# Patient Record
Sex: Male | Born: 1990 | Race: Black or African American | Hispanic: No | Marital: Single | Smoking: Never smoker
Health system: Southern US, Community
[De-identification: ages and names within clinical notes are randomized; demographics above are authoritative.]

---

## 2012-09-30 ENCOUNTER — Encounter (HOSPITAL_COMMUNITY): Payer: Self-pay | Admitting: Emergency Medicine

## 2012-09-30 ENCOUNTER — Emergency Department (HOSPITAL_COMMUNITY)
Admission: EM | Admit: 2012-09-30 | Discharge: 2012-09-30 | Disposition: A | Discharge status: Home / Self Care | Payer: Medicaid Other | Attending: Emergency Medicine | Admitting: Emergency Medicine

## 2012-09-30 DIAGNOSIS — B356 Tinea cruris: Secondary | ICD-10-CM | POA: Insufficient documentation

## 2012-09-30 DIAGNOSIS — Z79899 Other long term (current) drug therapy: Secondary | ICD-10-CM | POA: Insufficient documentation

## 2012-09-30 MED ORDER — CLOTRIMAZOLE 1 % EX CREA: TOPICAL_CREAM | Freq: Two times a day (BID) | CUTANEOUS | Status: AC

## 2012-09-30 NOTE — ED Notes (Signed)
Pt reports for about 4 weeks, having itching/burning to scrotal area; mom reports that she thought it was jock itch so bought some OTC meds, reports does not think pt followed instructions on taking meds; continues to get worse; pt does report hx of sexual activity but none recently

## 2012-09-30 NOTE — ED Provider Notes (Signed)
History  Scribed for Nelia Shi, MD, the patient was seen in room TR10C/TR10C. This chart was scribed by Candelaria Stagers. The patient's care started at 1:25 PM   CSN: [id number]  Arrival date & time 09/30/12  1253   First MD Initiated Contact with Patient 09/30/12 1315      Chief Complaint  Patient presents with  . Rash    The history is provided by the patient. No language interpreter was used.   Steven Cowan is a 21 y.o. male who presents to the Emergency Department complaining of a rash to the groin area that started several weeks ago.  Pt treated the rash as jock itch with cream with no relief.  Pt states that the rash burns when showering.    History reviewed. No pertinent past medical history.  History reviewed. No pertinent past surgical history.  History reviewed. No pertinent family history.  History  Substance Use Topics  . Smoking status: Never Smoker   . Smokeless tobacco: Not on file  . Alcohol Use: Yes     Comment: rarely      Review of Systems  All other systems reviewed and are negative.    Allergies  Review of patient's allergies indicates no known allergies.  Home Medications   Current Outpatient Rx  Name  Route  Sig  Dispense  Refill  . CLOTRIMAZOLE 1 % EX CREA   Topical   Apply topically 2 (two) times daily.   30 g   0     BP 128/66  Pulse 57  Temp 97.9 F (36.6 C) (Oral)  Resp 18  SpO2 100%  Physical Exam  Nursing note and vitals reviewed. Constitutional: He is oriented to person, place, and time. He appears well-developed and well-nourished. No distress.  HENT:  Head: Normocephalic and atraumatic.  Eyes: Pupils are equal, round, and reactive to light.  Neck: Normal range of motion.  Cardiovascular: Normal rate and intact distal pulses.   Pulmonary/Chest: No respiratory distress.  Abdominal: Normal appearance. He exhibits no distension.  Musculoskeletal: Normal range of motion.  Neurological: He is alert and oriented to  person, place, and time. No cranial nerve deficit.  Skin: Skin is warm and dry. Rash (c/w tinea. located groin area) noted.  Psychiatric: He has a normal mood and affect. His behavior is normal.    ED Course  Procedures   DIAGNOSTIC STUDIES:  COORDINATION OF CARE:     Labs Reviewed - No data to display No results found.   1. Tinea cruris       MDM  I personally performed the services described in this documentation, which was scribed in my presence. The recorded information has been reviewed and considered. I personally performed the services described in this documentation, which was scribed in my presence. The recorded information has been reviewed and considered.       Nelia Shi, MD 10/03/12 [phone]

## 2012-09-30 NOTE — Discharge Instructions (Signed)
Jock Itch  Jock itch is a germ infection of the groin and upper thighs. The type of germ that causes jock itch is a fungus. It is itchy and often feels like it is burning. It is common in people who play sports. Sweating and wearing certain athletic gear can cause this type of rash.  HOME CARE  · Take your medicines as told. Finish them even if you start to feel better.  · Wear loose-fitting clothing.  · Men should wear cotton boxer shorts.  · Women should wear cotton underwear.  · Avoid hot baths.  · Dry the groin area well after bathing.  GET HELP RIGHT AWAY IF:   · Your rash is worse or spreading.  · Your rash returns after treatment is finished.  · Your rash is not gone in 4 weeks.  · The area becomes red, warm, tender, and puffy (swollen).  · You have a fever.  MAKE SURE YOU:  · Understand these instructions.  · Will watch your condition.  · Will get help right away if you are not doing well or get worse.  Document Released: 02/09/2010 Document Revised: 02/07/2012 Document Reviewed: 02/09/2010  [employer]® Patient Information ©2013 [employer].

## 2019-01-23 ENCOUNTER — Encounter (HOSPITAL_COMMUNITY): Payer: Self-pay

## 2019-01-23 ENCOUNTER — Emergency Department (HOSPITAL_COMMUNITY)
Admission: EM | Admit: 2019-01-23 | Discharge: 2019-01-23 | Disposition: A | Discharge status: Home / Self Care | Payer: Self-pay | Attending: Emergency Medicine | Admitting: Emergency Medicine

## 2019-01-23 ENCOUNTER — Emergency Department (HOSPITAL_COMMUNITY): Payer: Self-pay

## 2019-01-23 DIAGNOSIS — J069 Acute upper respiratory infection, unspecified: Secondary | ICD-10-CM

## 2019-01-23 DIAGNOSIS — R1013 Epigastric pain: Secondary | ICD-10-CM

## 2019-01-23 LAB — COMPREHENSIVE METABOLIC PANEL
ALBUMIN: 4.4 g/dL (ref 3.5–5.0)
ALT: 19 U/L (ref 0–44)
ANION GAP: 8 (ref 5–15)
AST: 34 U/L (ref 15–41)
Alkaline Phosphatase: 63 U/L (ref 38–126)
BUN: 7 mg/dL (ref 6–20)
CALCIUM: 9 mg/dL (ref 8.9–10.3)
CHLORIDE: 106 mmol/L (ref 98–111)
CO2: 26 mmol/L (ref 22–32)
Creatinine, Ser: 0.95 mg/dL (ref 0.61–1.24)
GFR calc Af Amer: >60 mL/min (ref >60)
GFR calc non Af Amer: >60 mL/min (ref >60)
GLUCOSE: 90 mg/dL (ref 70–99)
Potassium: 3.3 mmol/L — ABNORMAL LOW (ref 3.5–5.1)
SODIUM: 140 mmol/L (ref 135–145)
Total Bilirubin: 0.1 mg/dL — ABNORMAL LOW (ref 0.3–1.2)
Total Protein: 7.1 g/dL (ref 6.5–8.1)

## 2019-01-23 LAB — CBC WITH DIFFERENTIAL/PLATELET
ABS IMMATURE GRANULOCYTES: 0.02 10*3/uL (ref 0.00–0.07)
BASOS ABS: 0 10*3/uL (ref 0.0–0.1)
BASOS PCT: 0 %
Eosinophils Absolute: 0.2 10*3/uL (ref 0.0–0.5)
Eosinophils Relative: 5 %
HCT: 43.1 % (ref 39.0–52.0)
HEMOGLOBIN: 13.6 g/dL (ref 13.0–17.0)
Immature Granulocytes: 0 %
LYMPHS ABS: 1.3 10*3/uL (ref 0.7–4.0)
LYMPHS PCT: 25 %
MCH: 29.6 pg (ref 26.0–34.0)
MCHC: 31.6 g/dL (ref 30.0–36.0)
MCV: 93.7 fL (ref 80.0–100.0)
MONO ABS: 0.7 10*3/uL (ref 0.1–1.0)
MONOS PCT: 14 %
Neutro Abs: 2.8 10*3/uL (ref 1.7–7.7)
Neutrophils Relative %: 56 %
PLATELETS: 189 10*3/uL (ref 150–400)
RBC: 4.6 MIL/uL (ref 4.22–5.81)
RDW: 11.6 % (ref 11.5–15.5)
WBC: 5 10*3/uL (ref 4.0–10.5)
nRBC: 0 % (ref 0.0–0.2)

## 2019-01-23 LAB — URINALYSIS, ROUTINE W REFLEX MICROSCOPIC
BILIRUBIN URINE: NEGATIVE
GLUCOSE, UA: NEGATIVE mg/dL
Hgb urine dipstick: NEGATIVE
KETONES UR: 5 mg/dL — AB
LEUKOCYTE UA: NEGATIVE
NITRITE: NEGATIVE
PROTEIN: 30 mg/dL — AB
Specific Gravity, Urine: 1.034 — ABNORMAL HIGH (ref 1.005–1.030)
pH: 5 (ref 5.0–8.0)

## 2019-01-23 LAB — LIPASE, BLOOD: Lipase: 33 U/L (ref 11–51)

## 2019-01-23 MED ORDER — KETOROLAC TROMETHAMINE 30 MG/ML IJ SOLN
30.0000 mg | Freq: Once | INTRAMUSCULAR | Status: AC
  Administered 2019-01-23: 30 mg via INTRAVENOUS
  Filled 2019-01-23: qty 1

## 2019-01-23 MED ORDER — ONDANSETRON HCL 4 MG PO TABS: 4.0000 mg | ORAL_TABLET | Freq: Three times a day (TID) | ORAL | 0 refills | Status: DC | PRN

## 2019-01-23 MED ORDER — SODIUM CHLORIDE 0.9 % IV BOLUS
1000.0000 mL | Freq: Once | INTRAVENOUS | Status: AC
  Administered 2019-01-23: 1000 mL via INTRAVENOUS

## 2019-01-23 MED ORDER — FAMOTIDINE 20 MG PO TABS: 20.0000 mg | ORAL_TABLET | Freq: Two times a day (BID) | ORAL | 0 refills | Status: AC

## 2019-01-23 MED ORDER — FLUTICASONE PROPIONATE 50 MCG/ACT NA SUSP: 1.0000 | Freq: Every day | NASAL | 0 refills | Status: AC

## 2019-01-23 NOTE — Discharge Instructions (Signed)
You were seen in the emergency department for central abdominal pain and also for pain around your eyes.  You had lab work that was unremarkable.  We are sending you with prescriptions for some acid medication for your stomach along with some nausea medicine.  There is also a steroid nasal spray to help relieve the congestion in your nose.  You should follow-up with your doctor and please return if any worsening symptoms.

## 2019-01-23 NOTE — ED Notes (Signed)
Patient transported to X-ray 

## 2019-01-23 NOTE — ED Provider Notes (Signed)
[facility]-EMERGENCY DEPT Provider Note   CSN: [id number] Arrival date & time: 01/23/19  7622    History   Chief Complaint Chief Complaint  Patient presents with  . URI  . Abdominal Pain    HPI Steven Cowan is a 28 y.o. male.  He has no significant past medical history.  He is complaining of getting sick starting 5 days ago when he acutely felt nauseous and vomited during dinner.  Since then he has had sharp upper abdominal pain that is been constant 9 out of 10 intensity.  There is been no further nausea vomiting or diarrhea or constipation but he does have decreased appetite.  He has been tolerating liquids.  He is also had some nasal congestion and gets a sharp pain in his head around his eyes when he blows his nose.  He feels some throat and chest congestion.  He is coughing up a little bit of phlegm.  No known fever.  No sick contacts or recent travel.     The history is provided by the patient.  Abdominal Pain  Pain location:  Epigastric Pain quality: stabbing   Pain radiates to:  Does not radiate Pain severity:  Severe Onset quality:  Gradual Timing:  Constant Progression:  Unchanged Chronicity:  New Context: recent illness   Context: not recent travel, not sick contacts and not trauma   Relieved by:  Nothing Worsened by:  Nothing Ineffective treatments:  Acetaminophen Associated symptoms: anorexia, cough, nausea and vomiting   Associated symptoms: no chest pain, no diarrhea, no dysuria, no fever, no hematemesis, no hematuria, no shortness of breath and no sore throat     History reviewed. No pertinent past medical history.  There are no active problems to display for this patient.   History reviewed. No pertinent surgical history.      Home Medications    Prior to Admission medications   Medication Sig Start Date End Date Taking? Authorizing Provider  clotrimazole ([employer] CLOTRIMAZOLE) 1 % cream Apply topically 2 (two) times daily.  09/30/12   Nelva Nay, MD    Family History History reviewed. No pertinent family history.  Social History Social History   Tobacco Use  . Smoking status: Never Smoker  Substance Use Topics  . Alcohol use: Yes    Comment: rarely  . Drug use: No     Allergies   Patient has no known allergies.   Review of Systems Review of Systems  Constitutional: Negative for fever.  HENT: Positive for rhinorrhea and sinus pain. Negative for sore throat.   Eyes: Negative for visual disturbance.  Respiratory: Positive for cough. Negative for shortness of breath.   Cardiovascular: Negative for chest pain.  Gastrointestinal: Positive for abdominal pain, anorexia, nausea and vomiting. Negative for diarrhea and hematemesis.  Genitourinary: Negative for dysuria and hematuria.  Musculoskeletal: Negative for neck pain.  Skin: Negative for rash.  Neurological: Negative for headaches.     Physical Exam Updated Vital Signs BP 126/66 (BP Location: Left Arm)   Pulse 81   Temp 97.7 F (36.5 C) (Oral)   Resp 14   SpO2 100%   Physical Exam Vitals signs and nursing note reviewed.  Constitutional:      Appearance: He is well-developed.  HENT:     Head: Normocephalic and atraumatic.  Eyes:     Conjunctiva/sclera: Conjunctivae normal.  Neck:     Musculoskeletal: Neck supple.  Cardiovascular:     Rate and Rhythm: Normal rate and regular rhythm.  Heart sounds: No murmur.  Pulmonary:     Effort: Pulmonary effort is normal. No respiratory distress.     Breath sounds: Normal breath sounds.  Abdominal:     Palpations: Abdomen is soft.     Tenderness: There is generalized abdominal tenderness. There is no guarding or rebound.  Skin:    General: Skin is warm and dry.     Capillary Refill: Capillary refill takes less than 2 seconds.  Neurological:     General: No focal deficit present.     Mental Status: He is alert and oriented to person, place, and time.      ED Treatments /  Results  Labs (all labs ordered are listed, but only abnormal results are displayed) Labs Reviewed  COMPREHENSIVE METABOLIC PANEL - Abnormal; Notable for the following components:      Result Value   Potassium 3.3 (*)    Total Bilirubin 0.1 (*)    All other components within normal limits  URINALYSIS, ROUTINE W REFLEX MICROSCOPIC - Abnormal; Notable for the following components:   Specific Gravity, Urine 1.034 (*)    Ketones, ur 5 (*)    Protein, ur 30 (*)    Bacteria, UA RARE (*)    All other components within normal limits  CBC WITH DIFFERENTIAL/PLATELET  LIPASE, BLOOD    EKG None  Radiology Dg Chest 2 View  Result Date: 01/23/2019 CLINICAL DATA:  Cough EXAM: CHEST - 2 VIEW COMPARISON:  None. FINDINGS: Lungs are clear. Heart size and pulmonary vascularity are normal. No adenopathy. No bone lesions. IMPRESSION: No edema or consolidation. Electronically Signed   By: Bretta Bang III M.D.   On: 01/23/2019 09:15    Procedures Procedures (including critical care time)  Medications Ordered in ED Medications  sodium chloride 0.9 % bolus 1,000 mL (has no administration in time range)  ketorolac (TORADOL) 30 MG/ML injection 30 mg (has no administration in time range)     Initial Impression / Assessment and Plan / ED Course  I have reviewed the triage vital signs and the nursing notes.  Pertinent labs & imaging results that were available during my care of the patient were reviewed by me and considered in my medical decision making (see chart for details).  Clinical Course as of Jan 24 754  Tue Jan 23, 2019  6769 28 year old male with for 5 days of upper abdominal pain constant unrelated to food.  He also has some upper respiratory infection symptoms.  His vitals are normal here including being afebrile.  He has a fairly benign exam with some vague abdominal tenderness but no guarding or rebound.  We are getting some screening labs and chest x-ray.   [MB]  [phone] Patient  received some Toradol and fluids and he states he is feeling better.  His work-up is been fairly unremarkable other than a slightly low potassium.  Chest x-ray negative.  We will start him on some acid medication and have him follow-up with his primary care doctor.   [MB]    Clinical Course User Index [MB] Terrilee Files, MD        Final Clinical Impressions(s) / ED Diagnoses   Final diagnoses:  Epigastric pain  Upper respiratory tract infection, unspecified type    ED Discharge Orders         Ordered    famotidine (PEPCID) 20 MG tablet  2 times daily     01/23/19 0944    ondansetron (ZOFRAN) 4 MG tablet  Every 8  hours PRN     01/23/19 0944    fluticasone (FLONASE) 50 MCG/ACT nasal spray  Daily     01/23/19 0946           Terrilee Files, MD 01/24/19 [phone]

## 2019-01-23 NOTE — ED Triage Notes (Addendum)
Patient c/o 1 episode of N/V that started about 4 days ago.  C/O generalized upper sharp abdominal pain.  C/O upper respiratory symptoms and not being able to breath as good as normal.  C/O head pressure in his eye balls. 9/10 pressure pain.  C/O runny nose and productive cough (yellow/brown/thick).   Denies diarrhea.    Patient has been taking over the counter severe cold and flu with no relief.   Patient here with mom.   Ambulatory in triage.

## 2019-01-23 NOTE — ED Notes (Signed)
Patient given discharge teaching and verbalized understanding. Patient ambulated out of ED with a steady gait. 

## 2020-02-03 IMAGING — CR DG CHEST 2V
2 series · 2 of 2 positions shown · non-contrast
Comparison: None.

CLINICAL DATA: Cough

EXAM:
CHEST - 2 VIEW

[w chest pa]
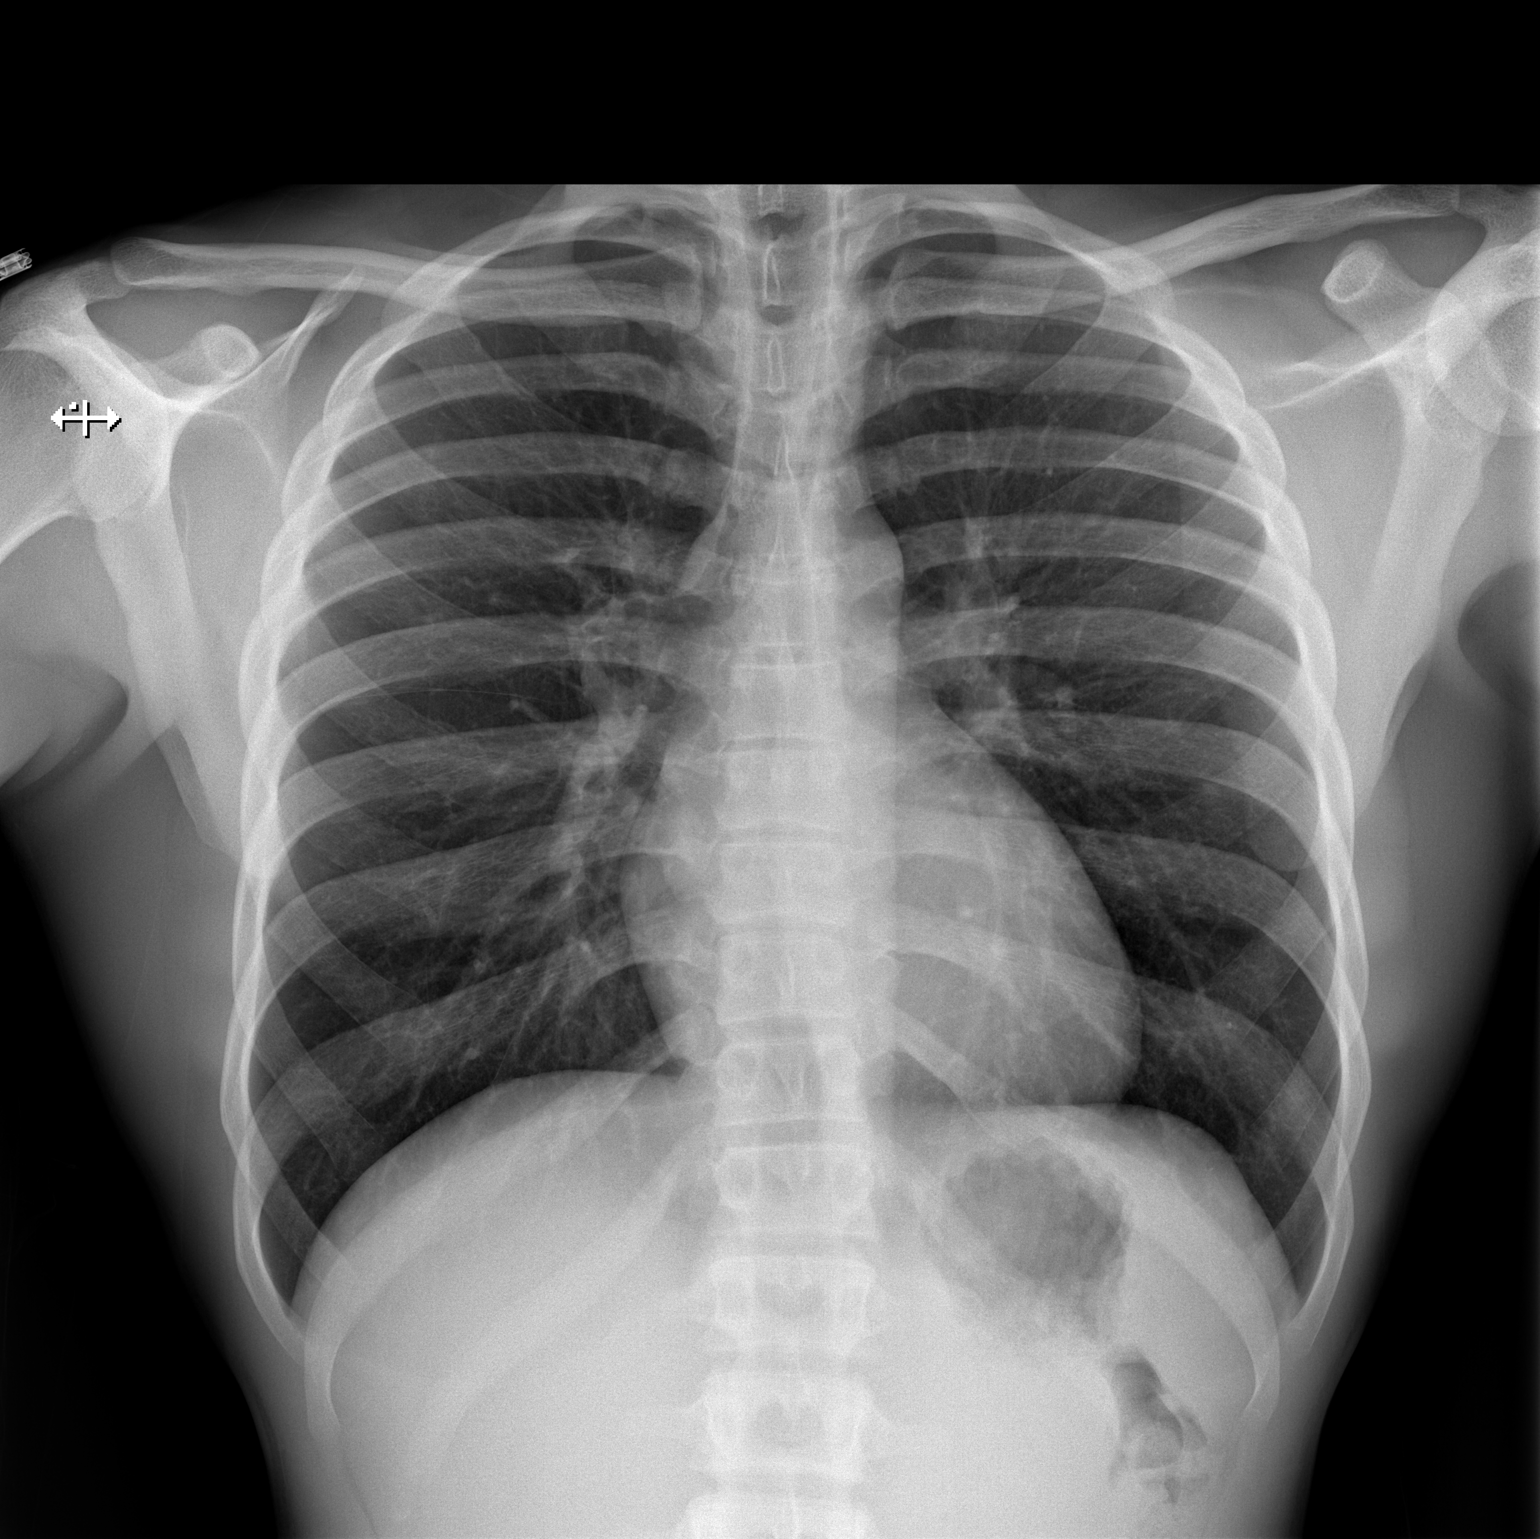

[w chest lat]
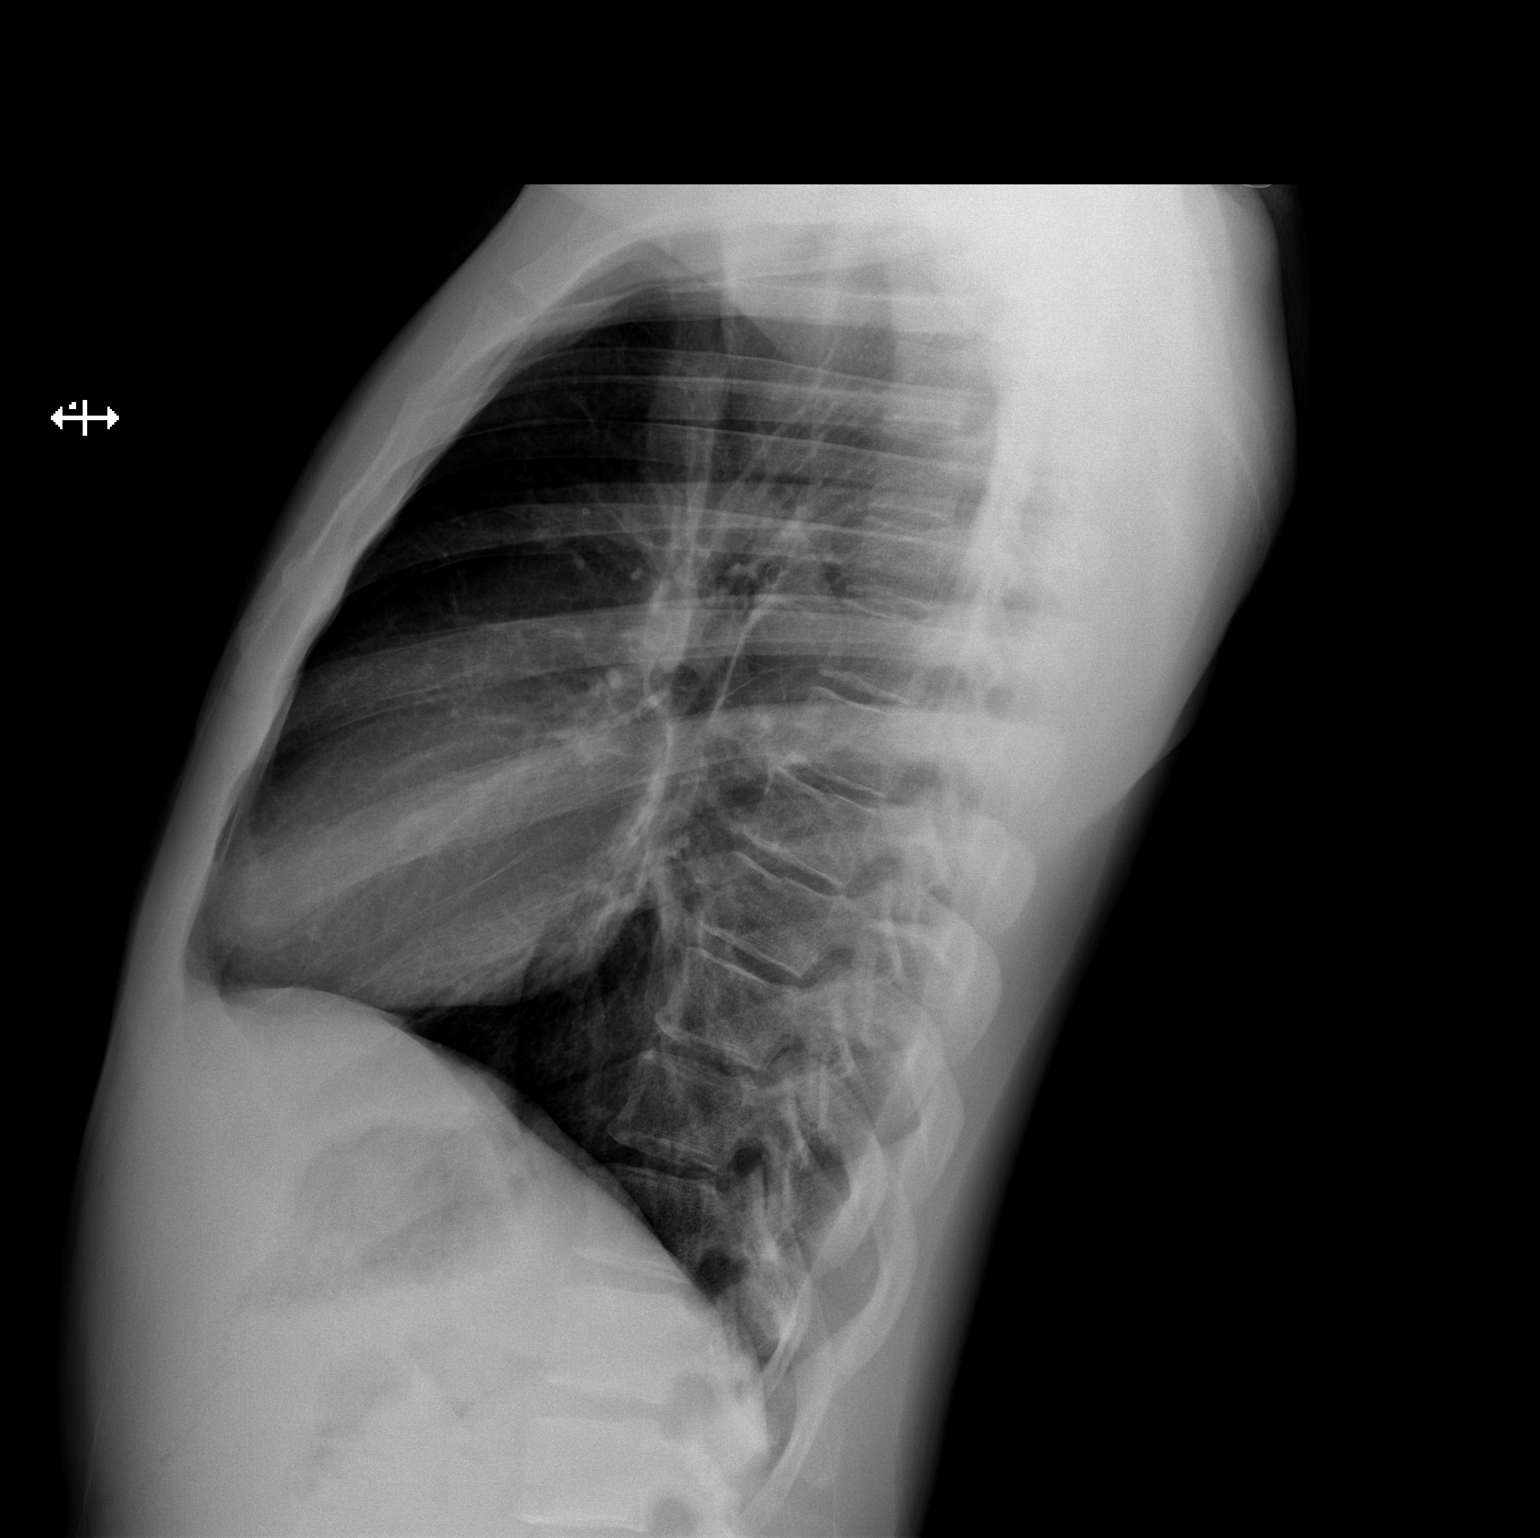

[2 of 2 positions shown; findings below may reference images not displayed]

FINDINGS: Lungs are clear. Heart size and pulmonary vascularity are normal. No
adenopathy. No bone lesions.
IMPRESSION: No edema or consolidation.

## 2020-03-10 ENCOUNTER — Other Ambulatory Visit: Payer: Self-pay

## 2020-03-10 ENCOUNTER — Emergency Department (HOSPITAL_COMMUNITY)
Admission: EM | Admit: 2020-03-10 | Discharge: 2020-03-10 | Disposition: A | Discharge status: Home / Self Care | Payer: Self-pay | Attending: Emergency Medicine | Admitting: Emergency Medicine

## 2020-03-10 ENCOUNTER — Encounter (HOSPITAL_COMMUNITY): Payer: Self-pay | Admitting: Emergency Medicine

## 2020-03-10 DIAGNOSIS — R05 Cough: Secondary | ICD-10-CM | POA: Insufficient documentation

## 2020-03-10 DIAGNOSIS — Z20822 Contact with and (suspected) exposure to covid-19: Secondary | ICD-10-CM | POA: Insufficient documentation

## 2020-03-10 DIAGNOSIS — R0602 Shortness of breath: Secondary | ICD-10-CM | POA: Insufficient documentation

## 2020-03-10 DIAGNOSIS — J069 Acute upper respiratory infection, unspecified: Secondary | ICD-10-CM | POA: Insufficient documentation

## 2020-03-10 LAB — SARS CORONAVIRUS 2 (TAT 6-24 HRS): SARS Coronavirus 2: NEGATIVE

## 2020-03-10 NOTE — ED Provider Notes (Signed)
[facility] DEPT Provider Note   CSN: [id number] Arrival date & time: 03/10/20  1138     History Chief Complaint  Patient presents with  . Nasal Congestion    Steven Cowan is a 29 y.o. male with no significant past med history presenting to emergency department with congestion and cough for approximately 1 week.  Says check several times her temperature and has not had a temperature higher than 22F. He has had a persistent cough for the past week.  Has been self medicating with NyQuil at home which seems to help.  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  He has not had his Covid vaccines.   He lives in a house with some younger siblings.  His younger sister recently began having a fever and URI symptoms yesterday.  They also live with an 27 year old grandparent in the house.  He does not smoke.  He has no other medical problems.  HPI     History reviewed. No pertinent past medical history.  There are no problems to display for this patient.   No past surgical history on file.     No family history on file.  Social History   Tobacco Use  . Smoking status: Never Smoker  Substance Use Topics  . Alcohol use: Yes    Comment: rarely  . Drug use: No    Home Medications Prior to Admission medications   Medication Sig Start Date End Date Taking? Authorizing Provider  clotrimazole ([employer] CLOTRIMAZOLE) 1 % cream Apply topically 2 (two) times daily. 09/30/12   Leonard Schwartz, MD  famotidine (PEPCID) 20 MG tablet Take 1 tablet (20 mg total) by mouth 2 (two) times daily. 01/23/19   Hayden Rasmussen, MD  fluticasone (FLONASE) 50 MCG/ACT nasal spray Place 1 spray into both nostrils daily. 01/23/19   Hayden Rasmussen, MD  ondansetron (ZOFRAN) 4 MG tablet Take 1 tablet (4 mg total) by mouth every 8 (eight) hours as needed for nausea or vomiting. 01/23/19   Hayden Rasmussen, MD    Allergies    Patient has no known  allergies.  Review of Systems   Review of Systems  Constitutional: Negative for chills and fever.  HENT: Positive for congestion and rhinorrhea. Negative for ear pain and sore throat.   Eyes: Negative for pain and visual disturbance.  Respiratory: Positive for cough, chest tightness and shortness of breath.   Cardiovascular: Negative for chest pain and palpitations.  Gastrointestinal: Negative for abdominal pain and vomiting.  Genitourinary: Negative for dysuria and hematuria.  Musculoskeletal: Negative for arthralgias and myalgias.  Skin: Negative for color change and rash.  Neurological: Positive for headaches. Negative for syncope.  Psychiatric/Behavioral: Negative for agitation and confusion.  All other systems reviewed and are negative.   Physical Exam Updated Vital Signs BP 130/76 (BP Location: Right Arm)   Pulse 89   Temp 98 F (36.7 C) (Oral)   Resp 19   SpO2 100%   Physical Exam Vitals and nursing note reviewed.  Constitutional:      Appearance: He is well-developed.  HENT:     Head: Normocephalic and atraumatic.  Eyes:     Conjunctiva/sclera: Conjunctivae normal.  Cardiovascular:     Rate and Rhythm: Normal rate and regular rhythm.     Pulses: Normal pulses.     Heart sounds: No murmur.  Pulmonary:     Effort: Pulmonary effort is normal. No respiratory distress.  Breath sounds: Normal breath sounds.  Abdominal:     Palpations: Abdomen is soft.     Tenderness: There is no abdominal tenderness.  Musculoskeletal:     Cervical back: Neck supple.  Skin:    General: Skin is warm and dry.  Neurological:     General: No focal deficit present.     Mental Status: He is alert and oriented to person, place, and time.  Psychiatric:        Mood and Affect: Mood normal.        Behavior: Behavior normal.     ED Results / Procedures / Treatments   Labs (all labs ordered are listed, but only abnormal results are displayed) Labs Reviewed  SARS CORONAVIRUS 2  (TAT 6-24 HRS)    EKG None  Radiology No results found.  Procedures Procedures (including critical care time)  Medications Ordered in ED Medications - No data to display  ED Course  I have reviewed the triage vital signs and the nursing notes.  Pertinent labs & imaging results that were available during my care of the patient were reviewed by me and considered in my medical decision making (see chart for details).  29 yo male here with viral URI symptoms for several days Cough and congestion ongoing, taking OTC medications with some relief  He is concerned about COVID-19.  We'll send out a test.  He lives with a younger sister who is asthmatic and an 22 year old grandparent, and he works in a public setting.  It's reasonable to test him.  No evidence of bacterial PNA on my exam.  He is quite well appearing.  Stable on room air.  NO respiratory distress.  Will discharge.  Steven Cowan was evaluated in Emergency Department on 03/10/2020 for the symptoms described in the history of present illness. He was evaluated in the context of the global COVID-19 pandemic, which necessitated consideration that the patient might be at risk for infection with the SARS-CoV-2 virus that causes COVID-19. Institutional protocols and algorithms that pertain to the evaluation of patients at risk for COVID-19 are in a state of rapid change based on information released by regulatory bodies including the CDC and federal and state organizations. These policies and algorithms were followed during the patient's care in the ED.    Final Clinical Impression(s) / ED Diagnoses Final diagnoses:  Upper respiratory tract infection, unspecified type    Rx / DC Orders ED Discharge Orders    None       Reigan Tolliver, Kermit Balo, MD 03/10/20 [phone]

## 2020-03-10 NOTE — ED Triage Notes (Signed)
Patient here from home with complaints of stuffy nose and phlegm x3 days. Denies sore throat.

## 2020-03-10 NOTE — Discharge Instructions (Addendum)
Your covid test will result in 24- 48 hours.  You can check online for your results.

## 2021-07-10 DIAGNOSIS — E559 Vitamin D deficiency, unspecified: Secondary | ICD-10-CM | POA: Diagnosis not present

## 2021-07-10 DIAGNOSIS — Z13220 Encounter for screening for lipoid disorders: Secondary | ICD-10-CM | POA: Diagnosis not present

## 2021-07-10 DIAGNOSIS — Z131 Encounter for screening for diabetes mellitus: Secondary | ICD-10-CM | POA: Diagnosis not present

## 2021-07-10 DIAGNOSIS — H6521 Chronic serous otitis media, right ear: Secondary | ICD-10-CM | POA: Diagnosis not present

## 2021-07-10 DIAGNOSIS — Z0000 Encounter for general adult medical examination without abnormal findings: Secondary | ICD-10-CM | POA: Diagnosis not present

## 2021-07-17 DIAGNOSIS — K5289 Other specified noninfective gastroenteritis and colitis: Secondary | ICD-10-CM | POA: Diagnosis not present

## 2021-08-12 ENCOUNTER — Emergency Department (HOSPITAL_COMMUNITY)
Admission: EM | Admit: 2021-08-12 | Discharge: 2021-08-12 | Disposition: A | Discharge status: Home / Self Care | Payer: BC Managed Care – PPO | Attending: Emergency Medicine | Admitting: Emergency Medicine

## 2021-08-12 ENCOUNTER — Encounter (HOSPITAL_COMMUNITY): Payer: Self-pay

## 2021-08-12 DIAGNOSIS — H9311 Tinnitus, right ear: Secondary | ICD-10-CM | POA: Diagnosis not present

## 2021-08-12 DIAGNOSIS — H938X1 Other specified disorders of right ear: Secondary | ICD-10-CM

## 2021-08-12 DIAGNOSIS — R110 Nausea: Secondary | ICD-10-CM | POA: Insufficient documentation

## 2021-08-12 DIAGNOSIS — M79601 Pain in right arm: Secondary | ICD-10-CM | POA: Insufficient documentation

## 2021-08-12 DIAGNOSIS — H9201 Otalgia, right ear: Secondary | ICD-10-CM | POA: Diagnosis not present

## 2021-08-12 MED ORDER — MECLIZINE HCL 25 MG PO TABS: 25.0000 mg | ORAL_TABLET | Freq: Three times a day (TID) | ORAL | 0 refills | Status: AC | PRN

## 2021-08-12 MED ORDER — METHOCARBAMOL 500 MG PO TABS
500.0000 mg | ORAL_TABLET | Freq: Two times a day (BID) | ORAL | 0 refills | Status: AC
Start: 2021-08-12 — End: ?

## 2021-08-12 MED ORDER — ONDANSETRON 4 MG PO TBDP: 4.0000 mg | ORAL_TABLET | Freq: Three times a day (TID) | ORAL | 0 refills | Status: AC | PRN

## 2021-08-12 NOTE — ED Notes (Signed)
An After Visit Summary was printed and given to the patient. Discharge instructions given and no further questions at this time.  

## 2021-08-12 NOTE — ED Triage Notes (Signed)
Pt arrived via POV, c/o right ear pressure, worsening hearing difficulty, pain in ear, that is causing pressure in ear. Was seen by PCP, given abx for ear infection, no relief.

## 2021-08-12 NOTE — ED Provider Notes (Signed)
[facility]-EMERGENCY DEPT Provider Note   CSN: [id number] Arrival date & time: 08/12/21  1122     History Chief Complaint  Patient presents with   Ear Fullness    Steven Cowan is a 30 y.o. male with no significant past medical history who presents with 1 to 2 weeks of ear pain, pressure, dizziness, nausea, and ringing.  Additionally patient complains of right shoulder pain when crossing his arm across the body.  Patient reports that he was seen by his primary care, who diagnosed him with an ear infection, and prescribed him antibiotics.  Patient reports that he took all of the antibiotics except for 1 day, reports improvement of some of the symptoms, but that they have returned at this time.  Patient reports vertigo like events this morning where he sat up suddenly from bed, felt nauseous, dizzy, and blacked out and had to collapse back into the bed.  Patient also endorses a constant high-pitched ringing in his right ear.  Patient reports some arm pain specifically when lifting his arm however his head, or crossing across his chest.  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  Patient is not taking anything for his arm pain at this time.   Ear Fullness Associated symptoms include headaches.      History reviewed. No pertinent past medical history.  There are no problems to display for this patient.   History reviewed. No pertinent surgical history.     History reviewed. No pertinent family history.  Social History   Tobacco Use   Smoking status: Never   Smokeless tobacco: Never  Substance Use Topics   Alcohol use: Yes    Comment: rarely   Drug use: No    Home Medications Prior to Admission medications   Medication Sig Start Date End Date Taking? Authorizing Provider  meclizine (ANTIVERT) 25 MG tablet Take 1 tablet (25 mg total) by mouth 3 (three) times  daily as needed for dizziness. Take as needed for vertigo symptoms 08/12/21  Yes Nevin Kozuch H, PA-C  methocarbamol (ROBAXIN) 500 MG tablet Take 1 tablet (500 mg total) by mouth 2 (two) times daily. 08/12/21  Yes Shadie Sweatman H, PA-C  ondansetron (ZOFRAN ODT) 4 MG disintegrating tablet Take 1 tablet (4 mg total) by mouth every 8 (eight) hours as needed for nausea or vomiting. 08/12/21  Yes Inita Uram H, PA-C  clotrimazole ([facility] CLOTRIMAZOLE) 1 % cream Apply topically 2 (two) times daily. 09/30/12   Nelva Nay, MD  famotidine (PEPCID) 20 MG tablet Take 1 tablet (20 mg total) by mouth 2 (two) times daily. 01/23/19   Terrilee Files, MD  fluticasone (FLONASE) 50 MCG/ACT nasal spray Place 1 spray into both nostrils daily. 01/23/19   Terrilee Files, MD    Allergies    Patient has no known allergies.  Review of Systems   Review of Systems  HENT:  Positive for ear pain and tinnitus. Negative for ear discharge, rhinorrhea and sinus pressure.   Neurological:  Positive for dizziness and headaches. Negative for light-headedness.  All other systems reviewed and are negative.  Physical Exam Updated Vital Signs BP 121/79   Pulse 65   Temp 98.1 F (36.7 C) (Oral)   Resp 18   SpO2 99%   Physical Exam Vitals and nursing note reviewed.  Constitutional:      General: He is not in  acute distress.    Appearance: Normal appearance.  HENT:     Head: Normocephalic and atraumatic.     Right Ear: Tympanic membrane, ear canal and external ear normal. There is no impacted cerumen.     Left Ear: Tympanic membrane, ear canal and external ear normal. There is no impacted cerumen.     Ears:     Comments: Able to hear whispered voice bilaterally    Nose: No congestion or rhinorrhea.  Eyes:     General:        Right eye: No discharge.        Left eye: No discharge.  Cardiovascular:     Rate and Rhythm: Normal rate and regular rhythm.     Comments: Radial and ulnar pulses intact on  the right side Pulmonary:     Effort: Pulmonary effort is normal. No respiratory distress.  Musculoskeletal:        General: No deformity.     Comments: TTP over clavicle, head of humerus on right side. 5/5 strength to flexion, extension, and internal and external rotation. Pain with cross-body adduction. No step offs or focal point tenderness.  Skin:    General: Skin is warm and dry.     Capillary Refill: Capillary refill takes less than 2 seconds.  Neurological:     Mental Status: He is alert and oriented to person, place, and time.     Comments: Right arm without numbness or tingling  Psychiatric:        Mood and Affect: Mood normal.        Behavior: Behavior normal.    ED Results / Procedures / Treatments   Labs (all labs ordered are listed, but only abnormal results are displayed) Labs Reviewed - No data to display  EKG None  Radiology No results found.  Procedures Procedures   Medications Ordered in ED Medications - No data to display  ED Course  I have reviewed the triage vital signs and the nursing notes.  Pertinent labs & imaging results that were available during my care of the patient were reviewed by me and considered in my medical decision making (see chart for details).    MDM Rules/Calculators/A&P                         Ear Pain / Dizziness / Fullness: Unable to replicate vertigo symptoms with tilting patient back in bed.  TMs are clear bilaterally no evidence of recurrent infection.  Consolation of symptoms including some vague balance issues, tinnitus, right ear fullness raise suspicion for Mnire's versus acoustic neuroma versus inner ear eustachian tube dysfunction.  Recommend follow-up with ENT.  Will prescribe Antivert for dizziness, Zofran for nausea.  Recommend patient return if he begins having severe headache, blurring vision, or other new neurologic symptoms.  Patient understands return precautions.  Right arm/shoulder pain: Neurovascularly  intact.  Based on pain with cross body abduction favor AC joint dysfunction/impingement.  This patient has not tried any pain reducing measures at this time I recommend ibuprofen, Tylenol, muscle relaxant for severe pain.  Additionally recommend patient do some stretching exercises for his right shoulder.  If pain worsens or fails to improve recommend follow-up with orthopedics. Final Clinical Impression(s) / ED Diagnoses Final diagnoses:  Right arm pain  Tinnitus of right ear  Sensation of fullness in right ear    Rx / DC Orders ED Discharge Orders          Ordered  meclizine (ANTIVERT) 25 MG tablet  3 times daily PRN        08/12/21 1332    ondansetron (ZOFRAN ODT) 4 MG disintegrating tablet  Every 8 hours PRN        08/12/21 1332    methocarbamol (ROBAXIN) 500 MG tablet  2 times daily        08/12/21 1332             Danyale Ridinger, [place], PA-C 08/12/21 1423    Wynetta Fines, MD 08/14/21 0900

## 2021-08-12 NOTE — Discharge Instructions (Signed)
As we discussed please follow-up with ENT, and orthopedics if the pain in your shoulder does not improve.  Please use Tylenol or ibuprofen for pain.  You may use 600 mg ibuprofen every 6 hours or 1000 mg of Tylenol every 6 hours.  You may choose to alternate between the 2.  This would be most effective.  Not to exceed 4 g of Tylenol within 24 hours.  Not to exceed 3200 mg ibuprofen 24 hours. You can take the muscle relaxant in addition to these as needed for muscle pain.  You can take the antivert for vertigo symptoms, you can take the zofran for nausea.

## 2022-01-04 DIAGNOSIS — M25511 Pain in right shoulder: Secondary | ICD-10-CM | POA: Diagnosis not present

## 2022-04-07 ENCOUNTER — Other Ambulatory Visit: Payer: Self-pay | Admitting: Internal Medicine

## 2022-04-07 DIAGNOSIS — E559 Vitamin D deficiency, unspecified: Secondary | ICD-10-CM | POA: Diagnosis not present

## 2022-04-07 DIAGNOSIS — Z13220 Encounter for screening for lipoid disorders: Secondary | ICD-10-CM | POA: Diagnosis not present

## 2022-04-07 DIAGNOSIS — Z131 Encounter for screening for diabetes mellitus: Secondary | ICD-10-CM | POA: Diagnosis not present

## 2022-04-07 DIAGNOSIS — Z0000 Encounter for general adult medical examination without abnormal findings: Secondary | ICD-10-CM | POA: Diagnosis not present

## 2022-04-07 DIAGNOSIS — N509 Disorder of male genital organs, unspecified: Secondary | ICD-10-CM | POA: Diagnosis not present

## 2022-04-07 DIAGNOSIS — M25511 Pain in right shoulder: Secondary | ICD-10-CM | POA: Diagnosis not present

## 2022-04-08 LAB — COMPLETE METABOLIC PANEL WITH GFR
AG Ratio: 1.7 (calc) (ref 1.0–2.5)
ALT: 14 U/L (ref 9–46)
AST: 21 U/L (ref 10–40)
Albumin: 4.4 g/dL (ref 3.6–5.1)
Alkaline phosphatase (APISO): 52 U/L (ref 36–130)
BUN: 12 mg/dL (ref 7–25)
CO2: 25 mmol/L (ref 20–32)
Calcium: 9.7 mg/dL (ref 8.6–10.3)
Chloride: 104 mmol/L (ref 98–110)
Creat: 1.06 mg/dL (ref 0.60–1.24)
Globulin: 2.6 g/dL (calc) (ref 1.9–3.7)
Glucose, Bld: 75 mg/dL (ref 65–99)
Potassium: 4.3 mmol/L (ref 3.5–5.3)
Sodium: 140 mmol/L (ref 135–146)
Total Bilirubin: 0.4 mg/dL (ref 0.2–1.2)
Total Protein: 7 g/dL (ref 6.1–8.1)
eGFR: 98 mL/min/{1.73_m2} (ref >=60)

## 2022-04-08 LAB — CBC
HCT: 40 % (ref 38.5–50.0)
Hemoglobin: 13.1 g/dL — ABNORMAL LOW (ref 13.2–17.1)
MCH: 29.2 pg (ref 27.0–33.0)
MCHC: 32.8 g/dL (ref 32.0–36.0)
MCV: 89.3 fL (ref 80.0–100.0)
MPV: 10.4 fL (ref 7.5–12.5)
Platelets: 210 10*3/uL (ref 140–400)
RBC: 4.48 10*6/uL (ref 4.20–5.80)
RDW: 11 % (ref 11.0–15.0)
WBC: 3.6 10*3/uL — ABNORMAL LOW (ref 3.8–10.8)

## 2022-04-08 LAB — LIPID PANEL
Cholesterol: 137 mg/dL (ref <200)
HDL: 50 mg/dL (ref >=40)
LDL Cholesterol (Calc): 69 mg/dL (calc)
Non-HDL Cholesterol (Calc): 87 mg/dL (calc) (ref <130)
Total CHOL/HDL Ratio: 2.7 (calc) (ref <5.0)
Triglycerides: 98 mg/dL (ref <150)

## 2022-04-08 LAB — VITAMIN D 25 HYDROXY (VIT D DEFICIENCY, FRACTURES): Vit D, 25-Hydroxy: 28 ng/mL — ABNORMAL LOW (ref 30–100)

## 2022-05-10 DIAGNOSIS — M25511 Pain in right shoulder: Secondary | ICD-10-CM | POA: Diagnosis not present

## 2022-05-10 DIAGNOSIS — M79675 Pain in left toe(s): Secondary | ICD-10-CM | POA: Diagnosis not present

## 2022-06-16 DIAGNOSIS — M545 Low back pain, unspecified: Secondary | ICD-10-CM | POA: Diagnosis not present

## 2022-06-16 DIAGNOSIS — M25511 Pain in right shoulder: Secondary | ICD-10-CM | POA: Diagnosis not present
